# Patient Record
Sex: Male | Born: 1995 | Hispanic: Yes | Marital: Single | Smoking: Never smoker
Health system: Southern US, Community
[De-identification: ages and names within clinical notes are randomized; demographics above are authoritative.]

---

## 2017-05-15 ENCOUNTER — Emergency Department (HOSPITAL_COMMUNITY): Payer: BLUE CROSS/BLUE SHIELD

## 2017-05-15 ENCOUNTER — Encounter (HOSPITAL_COMMUNITY): Payer: Self-pay | Admitting: Emergency Medicine

## 2017-05-15 ENCOUNTER — Emergency Department (HOSPITAL_COMMUNITY)
Admission: EM | Admit: 2017-05-15 | Discharge: 2017-05-15 | Disposition: A | Discharge status: Home / Self Care | Payer: BLUE CROSS/BLUE SHIELD | Attending: Emergency Medicine | Admitting: Emergency Medicine

## 2017-05-15 DIAGNOSIS — S199XXA Unspecified injury of neck, initial encounter: Secondary | ICD-10-CM | POA: Diagnosis present

## 2017-05-15 DIAGNOSIS — M25512 Pain in left shoulder: Secondary | ICD-10-CM | POA: Insufficient documentation

## 2017-05-15 DIAGNOSIS — S161XXA Strain of muscle, fascia and tendon at neck level, initial encounter: Secondary | ICD-10-CM

## 2017-05-15 DIAGNOSIS — Y92410 Unspecified street and highway as the place of occurrence of the external cause: Secondary | ICD-10-CM | POA: Diagnosis not present

## 2017-05-15 DIAGNOSIS — Y939 Activity, unspecified: Secondary | ICD-10-CM | POA: Diagnosis not present

## 2017-05-15 DIAGNOSIS — Y999 Unspecified external cause status: Secondary | ICD-10-CM | POA: Diagnosis not present

## 2017-05-15 DIAGNOSIS — R42 Dizziness and giddiness: Secondary | ICD-10-CM | POA: Insufficient documentation
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
## 2019-03-06 ENCOUNTER — Ambulatory Visit (HOSPITAL_COMMUNITY)
Admission: EM | Admit: 2019-03-06 | Discharge: 2019-03-06 | Disposition: A | Discharge status: Home / Self Care | Payer: BLUE CROSS/BLUE SHIELD | Attending: Family Medicine | Admitting: Family Medicine

## 2019-03-06 ENCOUNTER — Other Ambulatory Visit: Payer: Self-pay

## 2019-03-06 ENCOUNTER — Encounter (HOSPITAL_COMMUNITY): Payer: Self-pay | Admitting: Emergency Medicine

## 2019-03-06 DIAGNOSIS — R05 Cough: Secondary | ICD-10-CM | POA: Diagnosis not present

## 2019-03-06 DIAGNOSIS — R059 Cough, unspecified: Secondary | ICD-10-CM
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
## 2019-03-06 MED ORDER — CETIRIZINE HCL 10 MG PO TABS: 10.0000 mg | ORAL_TABLET | Freq: Every day | ORAL | 0 refills | Status: AC

## 2019-03-06 MED ORDER — BENZONATATE 100 MG PO CAPS: 100.0000 mg | ORAL_CAPSULE | Freq: Three times a day (TID) | ORAL | 0 refills | Status: AC

## 2019-03-06 NOTE — Discharge Instructions (Addendum)
You look well today.  May try zyrtec daily as well as tessalon as needed for cough.  If symptoms worsen or do not improve in the next week to return to be seen or to follow up with your PCP.

## 2019-03-06 NOTE — ED Triage Notes (Signed)
Per pt woke up with a cough this morning and heavy feeling in his chest. no fevers, no chills, no body aches.

## 2019-03-06 NOTE — ED Provider Notes (Signed)
[facility]    CSN: [id number] Arrival date & time: 03/06/19  1458     History   Chief Complaint Chief Complaint  Patient presents with  . Cough    HPI Earl Jackson is a 23 y.o. male.   Earl Jackson presents with complaints of chest heaviness with inhalation which he woke with this morning. Occasional dry cough. No shortness of breath . No congestion. No sore throat. No ear pain. No fever. Hasn't taken any medications for symptoms. No history of asthma. No recent travel. No leg pain or swelling. He used to vape, he quit 2 weeks ago. He works for [employer]. No specific known ill contacts. Without contributing medical history.     ROS per HPI, negative if not otherwise mentioned.      History reviewed. No pertinent past medical history.  There are no active problems to display for this patient.   History reviewed. No pertinent surgical history.     Home Medications    Prior to Admission medications   Medication Sig Start Date End Date Taking? Authorizing Provider  benzonatate (TESSALON) 100 MG capsule Take 1 capsule (100 mg total) by mouth every 8 (eight) hours. 03/06/19   Georgetta Haber, NP  cetirizine (ZYRTEC) 10 MG tablet Take 1 tablet (10 mg total) by mouth daily. 03/06/19   Georgetta Haber, NP    Family History No family history on file.  Social History Social History   Tobacco Use  . Smoking status: Never Smoker  . Smokeless tobacco: Never Used  Substance Use Topics  . Alcohol use: Not on file  . Drug use: Not on file     Allergies   Patient has no known allergies.   Review of Systems Review of Systems   Physical Exam Triage Vital Signs ED Triage Vitals  Enc Vitals Group     BP 03/06/19 1519 126/83     Pulse Rate 03/06/19 1519 94     Resp 03/06/19 1519 16     Temp 03/06/19 1519 98 F (36.7 C)     Temp Source 03/06/19 1519 Oral     SpO2 03/06/19 1519 97 %     Weight 03/06/19 1517 190 lb (86.2 kg)     Height  03/06/19 1517 6\' 1"  (1.854 m)     Head Circumference --      Peak Flow --      Pain Score 03/06/19 1517 0     Pain Loc --      Pain Edu? --      Excl. in GC? --    No data found.  Updated Vital Signs BP 126/83 (BP Location: Right Arm)   Pulse 94   Temp 98 F (36.7 C) (Oral)   Resp 16   Ht 6\' 1"  (1.854 m)   Wt 190 lb (86.2 kg)   SpO2 97%   BMI 25.07 kg/m    Physical Exam Vitals signs reviewed.  Constitutional:      Appearance: He is well-developed.  HENT:     Head: Normocephalic and atraumatic.     Right Ear: Tympanic membrane, ear canal and external ear normal.     Left Ear: Tympanic membrane, ear canal and external ear normal.     Nose: Nose normal.     Right Sinus: No maxillary sinus tenderness or frontal sinus tenderness.     Left Sinus: No maxillary sinus tenderness or frontal sinus tenderness.     Mouth/Throat:     Pharynx:  Uvula midline.  Eyes:     Conjunctiva/sclera: Conjunctivae normal.     Pupils: Pupils are equal, round, and reactive to light.  Neck:     Musculoskeletal: Normal range of motion.  Cardiovascular:     Rate and Rhythm: Normal rate and regular rhythm.  Pulmonary:     Effort: Pulmonary effort is normal.     Breath sounds: Normal breath sounds.  Lymphadenopathy:     Cervical: No cervical adenopathy.  Skin:    General: Skin is warm and dry.  Neurological:     Mental Status: He is alert and oriented to person, place, and time.      UC Treatments / Results  Labs (all labs ordered are listed, but only abnormal results are displayed) Labs Reviewed - No data to display  EKG None  Radiology No results found.  Procedures Procedures (including critical care time)  Medications Ordered in UC Medications - No data to display  Initial Impression / Assessment and Plan / UC Course  I have reviewed the triage vital signs and the nursing notes.  Pertinent labs & imaging results that were available during my care of the patient were  reviewed by me and considered in my medical decision making (see chart for details).     Non toxic. Benign physical exam.  History and physical consistent with viral illness vs allergies. Return precautions provided. If symptoms worsen or do not improve in the next week to return to be seen or to follow up with PCP.  Patient verbalized understanding and agreeable to plan.   Final Clinical Impressions(s) / UC Diagnoses   Final diagnoses:  Cough     Discharge Instructions     You look well today.  May try zyrtec daily as well as tessalon as needed for cough.  If symptoms worsen or do not improve in the next week to return to be seen or to follow up with your PCP.     ED Prescriptions    Medication Sig Dispense Auth. Provider   benzonatate (TESSALON) 100 MG capsule Take 1 capsule (100 mg total) by mouth every 8 (eight) hours. 21 capsule Linus Mako B, NP   cetirizine (ZYRTEC) 10 MG tablet Take 1 tablet (10 mg total) by mouth daily. 30 tablet Georgetta Haber, NP     Controlled Substance Prescriptions [place] Controlled Substance Registry consulted? Not Applicable   Georgetta Haber, NP 03/06/19 1643

## 2019-12-28 ENCOUNTER — Other Ambulatory Visit: Payer: Self-pay

## 2019-12-28 DIAGNOSIS — Z20822 Contact with and (suspected) exposure to covid-19: Secondary | ICD-10-CM

## 2019-12-29 LAB — NOVEL CORONAVIRUS, NAA: SARS-CoV-2, NAA: NOT DETECTED

## 2020-03-19 ENCOUNTER — Ambulatory Visit: Payer: BLUE CROSS/BLUE SHIELD | Attending: Internal Medicine
